# Patient Record
Sex: Female | Born: 2000 | Race: White | Hispanic: No | Marital: Single | Smoking: Never smoker
Health system: Southern US, Community
[De-identification: ages and names within clinical notes are randomized; demographics above are authoritative.]

---
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
## 2020-12-05 IMAGING — DX DG FOOT COMPLETE 3+V*R*
3 series · 3 of 3 positions shown · non-contrast
Comparison: None.

CLINICAL DATA: Dropped weight on foot

EXAM:
RIGHT FOOT COMPLETE - 3+ VIEW

[foot ap]
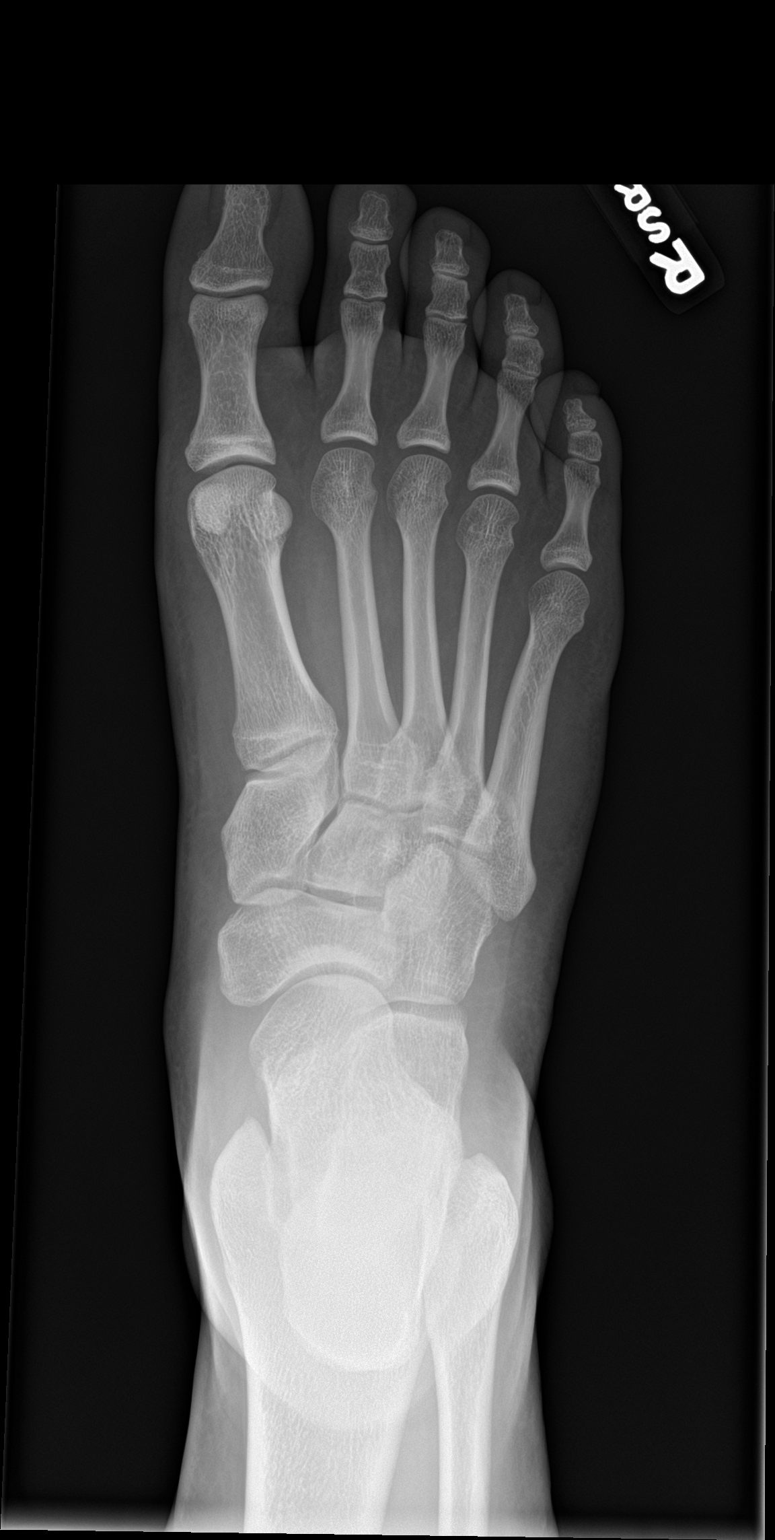

[foot obl]
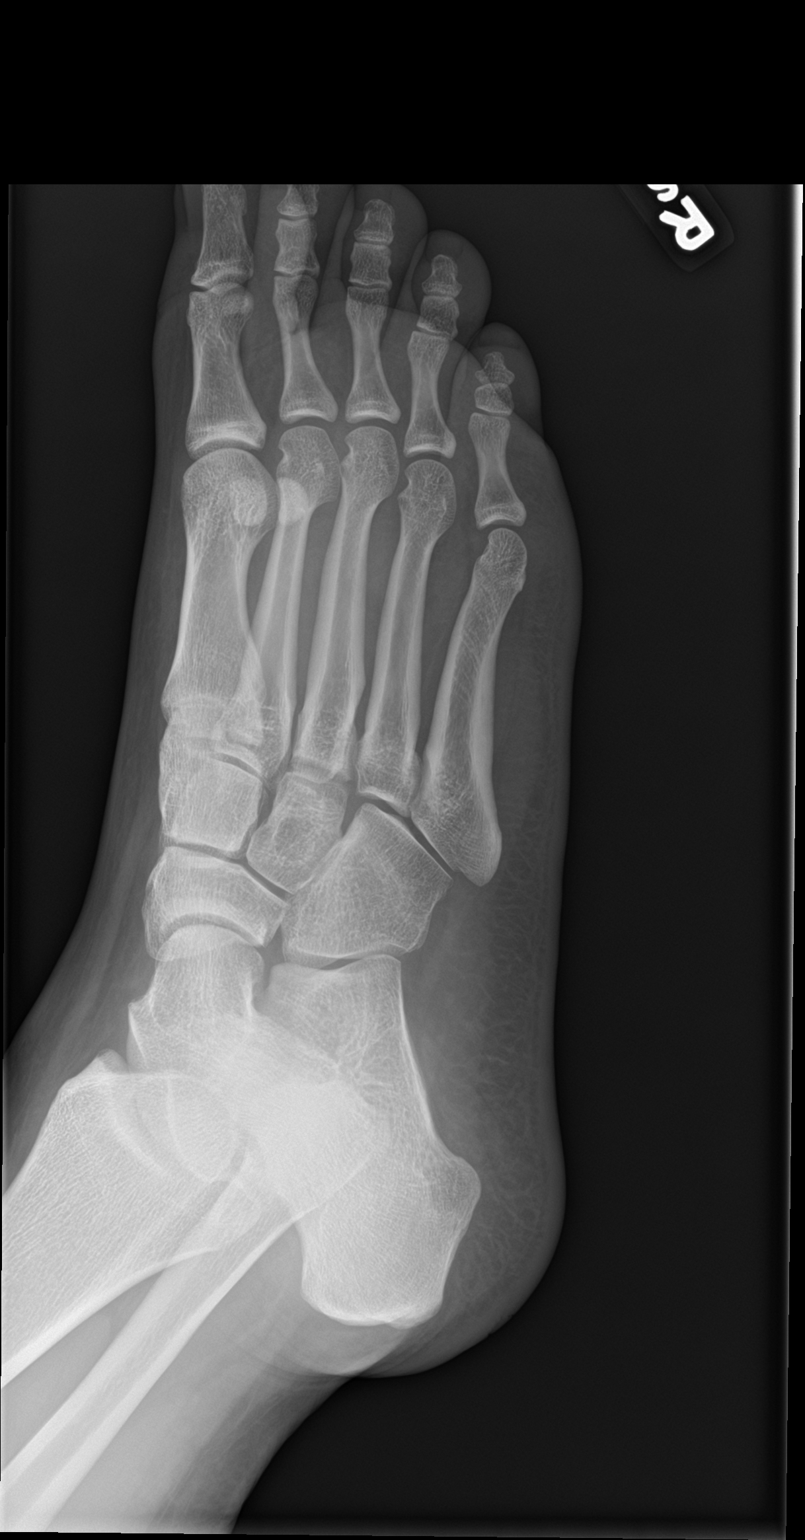

[foot lat]
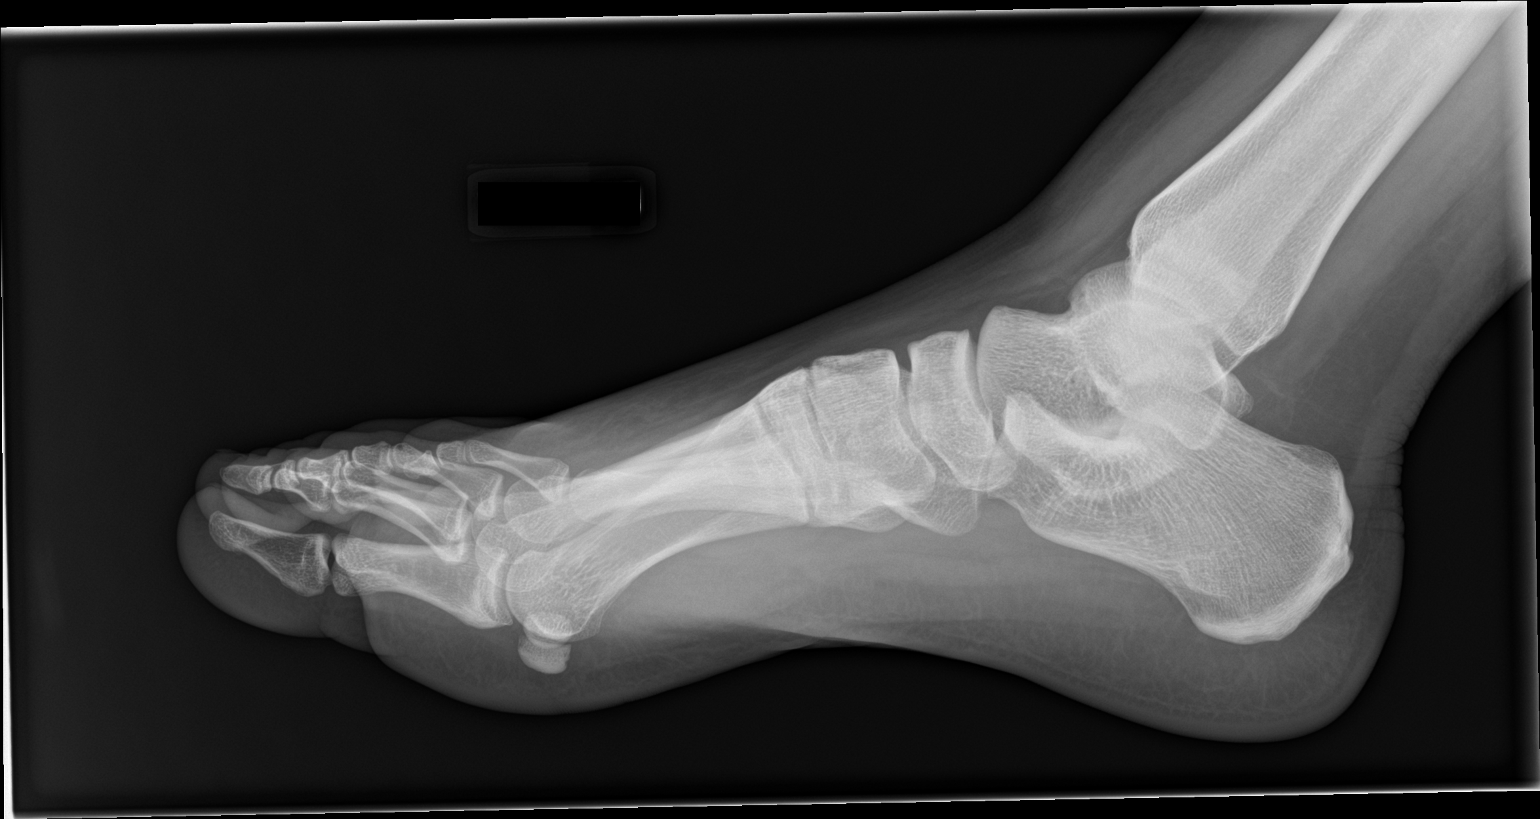

[3 of 3 positions shown; findings below may reference images not displayed]

FINDINGS: There is no evidence of fracture or dislocation. There is no
evidence of arthropathy or other focal bone abnormality. Soft tissue
edema about the dorsal forefoot.
IMPRESSION: No fracture or dislocation of the right foot. Soft tissue edema
about the dorsal forefoot.
